# Patient Record
Sex: Female | Born: 1995 | Hispanic: No | Marital: Single | Smoking: Never smoker
Health system: Southern US, Community
[De-identification: ages and names within clinical notes are randomized; demographics above are authoritative.]

## PROBLEM LIST (undated history)

## (undated) DIAGNOSIS — D649 Anemia, unspecified: Secondary | ICD-10-CM

## (undated) DIAGNOSIS — J45909 Unspecified asthma, uncomplicated: Secondary | ICD-10-CM

---
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
## 2016-01-25 ENCOUNTER — Encounter (HOSPITAL_COMMUNITY): Payer: Self-pay | Admitting: *Deleted

## 2016-01-25 ENCOUNTER — Emergency Department (HOSPITAL_COMMUNITY)
Admission: EM | Admit: 2016-01-25 | Discharge: 2016-01-26 | Disposition: A | Discharge status: Home / Self Care | Payer: Self-pay | Attending: Emergency Medicine | Admitting: Emergency Medicine

## 2016-01-25 DIAGNOSIS — J45901 Unspecified asthma with (acute) exacerbation: Secondary | ICD-10-CM | POA: Insufficient documentation

## 2016-01-25 MED ORDER — ALBUTEROL SULFATE (2.5 MG/3ML) 0.083% IN NEBU
5.0000 mg | INHALATION_SOLUTION | Freq: Once | RESPIRATORY_TRACT | Status: AC
  Administered 2016-01-25: 5 mg via RESPIRATORY_TRACT
  Filled 2016-01-25: qty 6

## 2016-01-25 MED ORDER — ALBUTEROL SULFATE HFA 108 (90 BASE) MCG/ACT IN AERS
2.0000 | INHALATION_SPRAY | RESPIRATORY_TRACT | Status: DC | PRN
  Administered 2016-01-26: 2 via RESPIRATORY_TRACT
  Filled 2016-01-25: qty 6.7

## 2016-01-25 MED ORDER — AEROCHAMBER PLUS W/MASK MISC
1.0000 | Freq: Once | Status: AC
  Administered 2016-01-26: 1
  Filled 2016-01-25: qty 1

## 2016-01-25 MED ORDER — IPRATROPIUM BROMIDE 0.02 % IN SOLN
0.5000 mg | Freq: Once | RESPIRATORY_TRACT | Status: AC
  Administered 2016-01-25: 0.5 mg via RESPIRATORY_TRACT
  Filled 2016-01-25: qty 2.5

## 2016-01-25 NOTE — Discharge Instructions (Signed)
Asthma, Adult Asthma is a condition of the lungs in which the airways tighten and narrow. Asthma can make it hard to breathe. Asthma cannot be cured, but medicine and lifestyle changes can help control it. Asthma may be started (triggered) by:  Animal skin flakes (dander).  Dust.  Cockroaches.  Pollen.  Mold.  Smoke.  Cleaning products.  Hair sprays or aerosol sprays.  Paint fumes or strong smells.  Cold air, weather changes, and winds.  Crying or laughing hard.  Stress.  Certain medicines or drugs.  Foods, such as dried fruit, potato chips, and sparkling grape juice.  Infections or conditions (colds, flu).  Exercise.  Certain medical conditions or diseases.  Exercise or tiring activities. HOME CARE   Take medicine as told by your doctor.  Use a peak flow meter as told by your doctor. A peak flow meter is a tool that measures how well the lungs are working.  Record and keep track of the peak flow meter's readings.  Understand and use the asthma action plan. An asthma action plan is a written plan for taking care of your asthma and treating your attacks.  To help prevent asthma attacks:  Do not smoke. Stay away from secondhand smoke.  Change your heating and air conditioning filter often.  Limit your use of fireplaces and wood stoves.  Get rid of pests (such as roaches and mice) and their droppings.  Throw away plants if you see mold on them.  Clean your floors. Dust regularly. Use cleaning products that do not smell.  Have someone vacuum when you are not home. Use a vacuum cleaner with a HEPA filter if possible.  Replace carpet with wood, tile, or vinyl flooring. Carpet can trap animal skin flakes and dust.  Use allergy-proof pillows, mattress covers, and box spring covers.  Wash bed sheets and blankets every week in hot water and dry them in a dryer.  Use blankets that are made of polyester or cotton.  Clean bathrooms and kitchens with bleach.  If possible, have someone repaint the walls in these rooms with mold-resistant paint. Keep out of the rooms that are being cleaned and painted.  Wash hands often. GET HELP IF:  You have make a whistling sound when breaking (wheeze), have shortness of breath, or have a cough even if taking medicine to prevent attacks.  The colored mucus you cough up (sputum) is thicker than usual.  The colored mucus you cough up changes from clear or white to yellow, green, gray, or bloody.  You have problems from the medicine you are taking such as:  A rash.  Itching.  Swelling.  Trouble breathing.  You need reliever medicines more than 2-3 times a week.  Your peak flow measurement is still at 50-79% of your personal best after following the action plan for 1 hour.  You have a fever. GET HELP RIGHT AWAY IF:   You seem to be worse and are not responding to medicine during an asthma attack.  You are short of breath even at rest.  You get short of breath when doing very little activity.  You have trouble eating, drinking, or talking.  You have chest pain.  You have a fast heartbeat.  Your lips or fingernails start to turn blue.  You are light-headed, dizzy, or faint.  Your peak flow is less than 50% of your personal best.   This information is not intended to replace advice given to you by your health care provider. Make sure   you discuss any questions you have with your health care provider.   Document Released: 01/19/2008 Document Revised: 04/23/2015 Document Reviewed: 03/01/2013 Elsevier Interactive Patient Education 2016 [employer] Inc[employer]  

## 2016-01-25 NOTE — ED Provider Notes (Signed)
CSN: [id number]     Arrival date & time 01/25/16  2232 History   First MD Initiated Contact with Patient 01/25/16 2343     Chief Complaint  Patient presents with  . Asthma   PT SAID THAT SHE HAS A HX OF ASTHMA, BUT DOES NOT HAVE AN INHALER.  SHE SAID THAT SHE HAS BEEN OUTSIDE HIKING ALL DAY AND FELT SOB.  SHE WAS WHEEZING AT TRIAGE.  (Consider location/radiation/quality/duration/timing/severity/associated sxs/prior Treatment) Patient is a 20 y.o. female presenting with asthma. The history is provided by the patient.  Asthma This is a recurrent problem. The current episode started 1 to 2 hours ago. The problem has been rapidly improving.    History reviewed. No pertinent past medical history. History reviewed. No pertinent past surgical history. No family history on file. Social History  Substance Use Topics  . Smoking status: Never Smoker   . Smokeless tobacco: None  . Alcohol Use: No   OB History    No data available     Review of Systems  Respiratory: Positive for wheezing.   All other systems reviewed and are negative.     Allergies  Review of patient's allergies indicates no known allergies.  Home Medications   Prior to Admission medications   Not on File   BP 144/95 mmHg  Pulse 102  Temp(Src) 98.6 F (37 C) (Oral)  Resp 18  Ht 5\' 3"  (1.6 m)  Wt 177 lb 1 oz (80.315 kg)  BMI 31.37 kg/m2  SpO2 100%  LMP 01/25/2016 Physical Exam  Constitutional: She is oriented to person, place, and time. She appears well-developed and well-nourished.  HENT:  Head: Normocephalic and atraumatic.  Right Ear: External ear normal.  Left Ear: External ear normal.  Nose: Nose normal.  Mouth/Throat: Oropharynx is clear and moist.  Eyes: Conjunctivae and EOM are normal. Pupils are equal, round, and reactive to light.  Neck: Normal range of motion. Neck supple.  Cardiovascular: Normal rate, regular rhythm, normal heart sounds and intact distal pulses.   Pulmonary/Chest: She has  wheezes.  Abdominal: Soft. Bowel sounds are normal.  Musculoskeletal: Normal range of motion.  Neurological: She is alert and oriented to person, place, and time.  Skin: Skin is warm and dry.  Psychiatric: She has a normal mood and affect. Her behavior is normal. Judgment and thought content normal.  Nursing note and vitals reviewed.   ED Course  Procedures (including critical care time) Labs Review Labs Reviewed - No data to display  Imaging Review No results found. I have personally reviewed and evaluated these images and lab results as part of my medical decision-making.   EKG Interpretation None      MDM  PT RECEIVED 1 NEB IN TRIAGE AND WHEEZING IS GONE.  PT FEELS BETTER.  NO INDICATION FOR STEROIDS.  PT WILL BE GIVEN AN INHALER AND INSTR TO RETURN IF WORSE.   Final diagnoses:  Asthma exacerbation        Jacalyn LefevreJulie Hilberto Burzynski, MD 01/25/16 2357

## 2016-01-25 NOTE — ED Notes (Signed)
The pt has been outside hiking al day.  She has not had a inhaler for one year.  She has chest congestion  lmp now

## 2016-01-26 NOTE — ED Notes (Signed)
Patient able to ambulate independently  

## 2016-09-07 ENCOUNTER — Ambulatory Visit (HOSPITAL_COMMUNITY)
Admission: EM | Admit: 2016-09-07 | Discharge: 2016-09-07 | Disposition: A | Discharge status: Home / Self Care | Payer: Self-pay | Attending: Family Medicine | Admitting: Family Medicine

## 2016-09-07 ENCOUNTER — Encounter (HOSPITAL_COMMUNITY): Payer: Self-pay | Admitting: *Deleted

## 2016-09-07 DIAGNOSIS — D508 Other iron deficiency anemias: Secondary | ICD-10-CM

## 2016-09-07 DIAGNOSIS — M542 Cervicalgia: Secondary | ICD-10-CM

## 2016-09-07 DIAGNOSIS — N939 Abnormal uterine and vaginal bleeding, unspecified: Secondary | ICD-10-CM

## 2016-09-07 DIAGNOSIS — F419 Anxiety disorder, unspecified: Secondary | ICD-10-CM

## 2016-09-07 DIAGNOSIS — R5383 Other fatigue: Secondary | ICD-10-CM

## 2016-09-07 DIAGNOSIS — G4709 Other insomnia: Secondary | ICD-10-CM

## 2016-09-07 DIAGNOSIS — G44209 Tension-type headache, unspecified, not intractable: Secondary | ICD-10-CM

## 2016-09-07 HISTORY — DX: Unspecified asthma, uncomplicated: J45.909

## 2016-09-07 LAB — POCT I-STAT, CHEM 8
BUN: 8 mg/dL (ref 6–20)
Calcium, Ion: 1.21 mmol/L (ref 1.15–1.40)
Chloride: 112 mmol/L — ABNORMAL HIGH (ref 101–111)
Creatinine, Ser: 0.6 mg/dL (ref 0.44–1.00)
GLUCOSE: 103 mg/dL — AB (ref 65–99)
HEMATOCRIT: 31 % — AB (ref 36.0–46.0)
Hemoglobin: 10.5 g/dL — ABNORMAL LOW (ref 12.0–15.0)
POTASSIUM: 3.5 mmol/L (ref 3.5–5.1)
Sodium: 141 mmol/L (ref 135–145)
TCO2: 26 mmol/L (ref 0–100)

## 2016-09-07 MED ORDER — CYCLOBENZAPRINE HCL 10 MG PO TABS: 10.0000 mg | ORAL_TABLET | Freq: Every day | ORAL | 0 refills | Status: AC

## 2016-09-07 MED ORDER — NAPROXEN 500 MG PO TABS: 500.0000 mg | ORAL_TABLET | Freq: Two times a day (BID) | ORAL | 0 refills | Status: AC

## 2016-09-07 MED ORDER — FERROUS SULFATE 325 (65 FE) MG PO TABS: 325.0000 mg | ORAL_TABLET | Freq: Every day | ORAL | 0 refills | Status: AC

## 2016-09-07 MED ORDER — DOCUSATE SODIUM 100 MG PO CAPS
100.0000 mg | ORAL_CAPSULE | Freq: Two times a day (BID) | ORAL | 0 refills | Status: AC
Start: 2016-09-07 — End: ?

## 2016-09-07 NOTE — ED Triage Notes (Signed)
Patient reports prolonged menstrual periods, states she has been on it since December. Reports being very tired, has not had follow up for this. Hx of anemia.  Patient also reports vomiting and headache.

## 2016-09-07 NOTE — ED Provider Notes (Signed)
CSN: [id number]     Arrival date & time 09/07/16  1338 History   First MD Initiated Contact with Patient 09/07/16 1503     Chief Complaint  Patient presents with  . Vaginal Bleeding   (Consider location/radiation/quality/duration/timing/severity/associated sxs/prior Treatment) Patient c/o weakness.  Patient states she has headache.  Patient c/o heavy menstrual bleeding.  Patient c/o anemia.  Patient c/o anxiety depressed mood.  Patient c/o insomnia.   The history is provided by the patient.  Vaginal Bleeding  Quality:  Clots Severity:  Moderate Onset quality:  Gradual Duration:  1 month Timing:  Constant Progression:  Waxing and waning Chronicity:  Recurrent Menstrual history:  Irregular Possible pregnancy: no   Relieved by:  Nothing Worsened by:  Nothing Ineffective treatments:  None tried Associated symptoms: back pain and fatigue     Past Medical History:  Diagnosis Date  . Asthma    History reviewed. No pertinent surgical history. History reviewed. No pertinent family history. Social History  Substance Use Topics  . Smoking status: Never Smoker  . Smokeless tobacco: Never Used  . Alcohol use No   OB History    No data available     Review of Systems  Constitutional: Positive for fatigue.  HENT: Negative.   Eyes: Positive for photophobia.  Respiratory: Negative.   Genitourinary: Positive for vaginal bleeding.  Musculoskeletal: Positive for arthralgias and back pain.  Neurological: Positive for headaches.  Hematological: Bruises/bleeds easily.  Psychiatric/Behavioral: Positive for agitation and sleep disturbance.    Allergies  Patient has no known allergies.  Home Medications   Prior to Admission medications   Medication Sig Start Date End Date Taking? Authorizing Provider  cyclobenzaprine (FLEXERIL) 10 MG tablet Take 1 tablet (10 mg total) by mouth at bedtime. 09/07/16   Deatra Canter, FNP  docusate sodium (COLACE) 100 MG capsule Take 1 capsule  (100 mg total) by mouth every 12 (twelve) hours. 09/07/16   Deatra Canter, FNP  ferrous sulfate 325 (65 FE) MG tablet Take 1 tablet (325 mg total) by mouth daily. 09/07/16   Deatra Canter, FNP  naproxen (NAPROSYN) 500 MG tablet Take 1 tablet (500 mg total) by mouth 2 (two) times daily with a meal. 09/07/16   Deatra Canter, FNP   Meds Ordered and Administered this Visit  Medications - No data to display  There were no vitals taken for this visit. No data found.   Physical Exam  Constitutional: She is oriented to person, place, and time. She appears well-developed and well-nourished.  HENT:  Head: Normocephalic and atraumatic.  Right Ear: External ear normal.  Left Ear: External ear normal.  Mouth/Throat: Oropharynx is clear and moist.  Eyes: Conjunctivae and EOM are normal. Pupils are equal, round, and reactive to light.  Neck: Normal range of motion. Neck supple.  Cardiovascular: Normal rate, regular rhythm and normal heart sounds.   Pulmonary/Chest: Effort normal and breath sounds normal.  Abdominal: Soft. Bowel sounds are normal.  Musculoskeletal: She exhibits tenderness. She exhibits no edema.  TTP myofascial and cervical paraspinous muscles TTP left upper lumbar muscles and possible muscles spasm left upper lumbar spine.  Neurological: She is alert and oriented to person, place, and time.  Psychiatric:  Depressed mood  Nursing note and vitals reviewed.   Urgent Care Course     Procedures (including critical care time)  Labs Review Labs Reviewed  POCT I-STAT, CHEM 8 - Abnormal; Notable for the following:       Result Value  Chloride 112 (*)    Glucose, Bld 103 (*)    Hemoglobin 10.5 (*)    HCT 31.0 (*)    All other components within normal limits    Imaging Review No results found.   Visual Acuity Review  Right Eye Distance:   Left Eye Distance:   Bilateral Distance:    Right Eye Near:   Left Eye Near:    Bilateral Near:         MDM   1.  Abnormal uterine bleeding   2. Other insomnia   3. Anxiety   4. Tension headache   5. Cervicalgia   6. Fatigue, unspecified type   7. Other iron deficiency anemia    Naprosyn 500mg  one po bid x 7 days Flexeril 10mg  one po qhs #10 Iron sulfate 325 one po qd #30 Colace 100mg  one po bid #60  Explained she needs to get in with PCP and have her multiple chronic problems addressed.  I have provided name, address, phone number of PCP she can call make an appointment with and follow up.  May follow up prn here as well.      Deatra CanterWilliam J Oxford, FNP 09/07/16 [phone]

## 2016-10-25 ENCOUNTER — Emergency Department (HOSPITAL_COMMUNITY)
Admission: EM | Admit: 2016-10-25 | Discharge: 2016-10-25 | Disposition: A | Discharge status: Home / Self Care | Payer: Self-pay | Attending: Emergency Medicine | Admitting: Emergency Medicine

## 2016-10-25 ENCOUNTER — Encounter (HOSPITAL_COMMUNITY): Payer: Self-pay | Admitting: Emergency Medicine

## 2016-10-25 DIAGNOSIS — J45909 Unspecified asthma, uncomplicated: Secondary | ICD-10-CM | POA: Insufficient documentation

## 2016-10-25 DIAGNOSIS — Z79899 Other long term (current) drug therapy: Secondary | ICD-10-CM | POA: Insufficient documentation

## 2016-10-25 DIAGNOSIS — K1379 Other lesions of oral mucosa: Secondary | ICD-10-CM | POA: Insufficient documentation

## 2016-10-25 DIAGNOSIS — K121 Other forms of stomatitis: Secondary | ICD-10-CM

## 2016-10-25 MED ORDER — SILVER NITRATE-POT NITRATE 75-25 % EX MISC
1.0000 "application " | Freq: Once | CUTANEOUS | Status: AC
  Administered 2016-10-25: 1 via TOPICAL
  Filled 2016-10-25: qty 1

## 2016-10-25 NOTE — ED Provider Notes (Signed)
[facility]-EMERGENCY DEPT Provider Note   CSN: [id number] Arrival date & time: 10/25/16  1830  By signing my name below, I, Linna Darner, attest that this documentation has been prepared under the direction and in the presence of non-physician practitioner, [facility] M. Damian Leavell, NP. Electronically Signed: Linna Darner, Scribe. 10/25/2016. 7:04 PM.  History   Chief Complaint Chief Complaint  Patient presents with  . Mouth Lesions    The history is provided by the patient. No language interpreter was used.  Mouth Lesions  Location:  Buccal mucosa Buccal mucosa location:  R buccal mucosa Quality:  Painful and ulcerous Pain details:    Quality:  Unable to specify   Severity:  Moderate   Duration:  6 days   Timing:  Constant   Progression:  Worsening Onset quality:  Unable to specify Severity:  Moderate Duration:  6 days Progression:  Worsening Chronicity:  New Relieved by:  None tried Exacerbated by: applied pressure. Ineffective treatments:  None tried Associated symptoms: no congestion, no fever, no rash and no sore throat     HPI Comments: Shannon Hays is a 21 y.o. female who presents to the Emergency Department complaining of a gradually worsening, painful oral lesion to the right buccal mucosa beginning 5 days ago. She notes some associated right-sided cervical adenopathy. Pt endorses pain exacerbation with applied pressure to the lesion. No known contacts with similar symptoms. She denies fever, chills, sore throat, dental problem, nausea, vomiting, diarrhea, abdominal pain, rash, cough, nasal congestion, or any other associated symptoms.  Past Medical History:  Diagnosis Date  . Asthma     There are no active problems to display for this patient.   History reviewed. No pertinent surgical history.  OB History    No data available       Home Medications    Prior to Admission medications   Medication Sig Start Date End Date Taking? Authorizing Provider    cyclobenzaprine (FLEXERIL) 10 MG tablet Take 1 tablet (10 mg total) by mouth at bedtime. 09/07/16   Deatra Canter, FNP  docusate sodium (COLACE) 100 MG capsule Take 1 capsule (100 mg total) by mouth every 12 (twelve) hours. 09/07/16   Deatra Canter, FNP  ferrous sulfate 325 (65 FE) MG tablet Take 1 tablet (325 mg total) by mouth daily. 09/07/16   Deatra Canter, FNP  naproxen (NAPROSYN) 500 MG tablet Take 1 tablet (500 mg total) by mouth 2 (two) times daily with a meal. 09/07/16   Deatra Canter, FNP    Family History No family history on file.  Social History Social History  Substance Use Topics  . Smoking status: Never Smoker  . Smokeless tobacco: Never Used  . Alcohol use No     Allergies   Patient has no known allergies.   Review of Systems Review of Systems  Constitutional: Negative for chills and fever.  HENT: Positive for mouth sores. Negative for congestion, dental problem and sore throat.   Respiratory: Negative for cough.   Gastrointestinal: Negative for abdominal pain, diarrhea, nausea and vomiting.  Skin: Negative for rash.  Hematological: Positive for adenopathy.  All other systems reviewed and are negative.   Physical Exam Updated Vital Signs BP 118/69 (BP Location: Left Arm)   Pulse 91   Temp 99 F (37.2 C) (Oral)   Resp 16   Ht 5\' 4"  (1.626 m)   Wt 83.9 kg   SpO2 99%   BMI 31.76 kg/m   Physical Exam  Constitutional:  She is oriented to person, place, and time. She appears well-developed and well-nourished. No distress.  HENT:  Head: Normocephalic and atraumatic.  Mouth/Throat: Uvula is midline. No posterior oropharyngeal edema or posterior oropharyngeal erythema.  Ulcer lesion to the buccal mucosa on the right.  Eyes: Conjunctivae and EOM are normal.  Neck: Neck supple. No tracheal deviation present.  Cardiovascular: Normal rate.   Pulmonary/Chest: Effort normal. No respiratory distress.  Musculoskeletal: Normal range of motion.   Lymphadenopathy:    She has cervical adenopathy.  Slightly enlarged anterior lymph node on the right.  Neurological: She is alert and oriented to person, place, and time.  Skin: Skin is warm and dry.  Psychiatric: She has a normal mood and affect. Her behavior is normal.  Nursing note and vitals reviewed.   ED Treatments / Results  Labs (all labs ordered are listed, but only abnormal results are displayed) Labs Reviewed - No data to display  Procedures Procedures (including critical care time)  7:32 PM: Oral ulcer cauterized with silver nitrite. Patient tolerated well.  DIAGNOSTIC STUDIES: Oxygen Saturation is 100% on RA, normal by my interpretation.    COORDINATION OF CARE: 7:10 PM Discussed treatment plan with pt at bedside and pt agreed to plan.  Medications Ordered in ED Medications  silver nitrate applicators applicator 1 application (1 application Topical Given 10/25/16 1929)     Initial Impression / Assessment and Plan / ED Course  I have reviewed the triage vital signs and the nursing notes.  Final Clinical Impressions(s) / ED Diagnoses  21 y.o. female with oral ulcer and right anterior cervical lymph node enlargement stable for d/c without fever, rash or other problems. Discussed with the patient causes of oral ulcers and treatment. She voices understandings. Return precautions given.   Final diagnoses:  Oral ulcer    New Prescriptions Discharge Medication List as of 10/25/2016  7:31 PM    I personally performed the services described in this documentation, which was scribed in my presence. The recorded information has been reviewed and is accurate.    [address] Clear[place] Lizabeth Fellner, [place] 10/26/16 [id number]    Mancel BaleElliott Wentz, MD 10/28/16 1045

## 2016-10-25 NOTE — ED Notes (Signed)
Pt presents with small lesion to inside right sided cheek. See providers assessment.

## 2016-10-25 NOTE — ED Triage Notes (Signed)
Pt c/o ulcer in right side of  Mouth x's 5 days.  Pt st's very painful to talk or eat

## 2016-11-03 ENCOUNTER — Encounter: Payer: Self-pay | Admitting: Obstetrics & Gynecology

## 2016-11-03 ENCOUNTER — Ambulatory Visit (INDEPENDENT_AMBULATORY_CARE_PROVIDER_SITE_OTHER): Payer: Self-pay | Admitting: Obstetrics & Gynecology

## 2016-11-03 VITALS — BP 131/79 | HR 95 | Wt 179.1 lb

## 2016-11-03 DIAGNOSIS — D649 Anemia, unspecified: Secondary | ICD-10-CM

## 2016-11-03 DIAGNOSIS — N938 Other specified abnormal uterine and vaginal bleeding: Secondary | ICD-10-CM

## 2016-11-03 NOTE — Progress Notes (Signed)
Patient has been experiencing abnormal bleeding for several months now.

## 2016-11-03 NOTE — Progress Notes (Signed)
   Subjective:    Patient ID: Shannon MaffucciIlse V Hernandez-Salais, female    DOB: 07/08/1996, 21 y.o.   MRN: [id number]  HPI 21 yo S H G0 here today with the issue of heavy periods. Menarche at about 21 yo. Her periods are irregular. She has bled for almost 3 months straight and in the past has skipped periods. Her hbg was 10.5 on 09/07/16. She has not seen a doctor for this or been treated for this, has never had an u/s.   Review of Systems Virginal [place] school in [place], has been in the [place] for about 18 months Works at [employer]    Objective:   Physical Exam [id number], appeared teary when we talked about a vaginal u/s, very worried about pain Abd- benign       Assessment & Plan:  DUB with amenia- schedule gyn u/s, warned her that this will be a vaginal approach. I will also check a TSH

## 2016-11-04 LAB — TSH: TSH: 2.6 u[IU]/mL (ref 0.450–4.500)

## 2016-11-10 ENCOUNTER — Ambulatory Visit (HOSPITAL_COMMUNITY)
Admission: RE | Admit: 2016-11-10 | Discharge: 2016-11-10 | Disposition: A | Discharge status: Home / Self Care | Payer: Self-pay | Source: Ambulatory Visit | Attending: Obstetrics & Gynecology | Admitting: Obstetrics & Gynecology

## 2016-11-10 DIAGNOSIS — N938 Other specified abnormal uterine and vaginal bleeding: Secondary | ICD-10-CM | POA: Insufficient documentation

## 2016-12-08 ENCOUNTER — Telehealth: Payer: Self-pay | Admitting: *Deleted

## 2016-12-08 NOTE — Telephone Encounter (Signed)
Pt left message yesterday stating that she had [place] a month ago and has not been called with the results.

## 2016-12-09 NOTE — Telephone Encounter (Signed)
Attempted to call patient. There was no answer and voice mail was not set up. Will try to call later. U/s and TSH were normal.

## 2016-12-13 NOTE — Telephone Encounter (Signed)
Patient has been informed of test results ultasound and tsh.

## 2017-06-27 ENCOUNTER — Encounter: Payer: Self-pay | Admitting: *Deleted

## 2017-06-27 ENCOUNTER — Telehealth: Payer: Self-pay | Admitting: *Deleted

## 2017-06-27 NOTE — Telephone Encounter (Signed)
Received message left on nurse voicemail on 06/23/17 at 1329.  Patient states she was previously seen in our office for DUB.  States she has had normal periods from approximately March to September.  States she has been bleeding the month of October.  States she had an ultrasound in March and never got the results.  Requests a return call to obtain her results.    Attempted to contact patient via phone.  No answer, left voicemail message.  Will send letter.

## 2017-06-29 NOTE — Telephone Encounter (Signed)
Received message left on nurse voicemail on 06/29/17 at 0933.  Patient states she had previously gotten her ultrasound results but would like to review them again.    Attempted to contact patient via phone.  Message said phone forwarded to automated message system.  Mailed letter to patient with results on 06/27/17.

## 2017-12-20 ENCOUNTER — Other Ambulatory Visit: Payer: Self-pay

## 2017-12-20 ENCOUNTER — Emergency Department (HOSPITAL_COMMUNITY)
Admission: EM | Admit: 2017-12-20 | Discharge: 2017-12-20 | Disposition: A | Discharge status: Home / Self Care | Payer: Self-pay | Attending: Emergency Medicine | Admitting: Emergency Medicine

## 2017-12-20 ENCOUNTER — Encounter (HOSPITAL_COMMUNITY): Payer: Self-pay | Admitting: Emergency Medicine

## 2017-12-20 DIAGNOSIS — L559 Sunburn, unspecified: Secondary | ICD-10-CM | POA: Insufficient documentation

## 2017-12-20 DIAGNOSIS — Z79899 Other long term (current) drug therapy: Secondary | ICD-10-CM | POA: Insufficient documentation

## 2017-12-20 DIAGNOSIS — J45909 Unspecified asthma, uncomplicated: Secondary | ICD-10-CM | POA: Insufficient documentation

## 2017-12-20 HISTORY — DX: Anemia, unspecified: D64.9

## 2017-12-20 MED ORDER — MUPIROCIN 2 % EX OINT: 1.0000 "application " | TOPICAL_OINTMENT | Freq: Three times a day (TID) | CUTANEOUS | 1 refills | Status: AC

## 2017-12-20 NOTE — ED Provider Notes (Signed)
[facility] EMERGENCY DEPARTMENT Provider Note   CSN: [id number] Arrival date & time: 12/20/17  1233     History   Chief Complaint Chief Complaint  Patient presents with  . Skin irritation    HPI Shannon Hays is a 22 y.o. female with no significant past medical history presents emergency department today for scalp irritation.  Patient states that she was at a concert and for several hours on Friday, Saturday and Sunday.  She noticed some redness on the part of her hair the day later.  She notes the area is red and tender to the touch.  She reports that she has some yellow crusting along the area as well that she has picked off in the past.  She denies any blisters that have formed or pop. Denies fever, chills, contacts with persons with similar rash, or any changes in lotions/soaps/detergents, exposure to animal or plant irritants, and denies swelling or purulent discharge. No new medications.  No lip swelling, tongue swelling, difficulty breathing or swallowing, abdominal cramping, vomiting or diarrhea.  HPI  Past Medical History:  Diagnosis Date  . Anemia   . Asthma     Patient Active Problem List   Diagnosis Date Noted  . DUB (dysfunctional uterine bleeding) 11/03/2016  . Anemia 11/03/2016    History reviewed. No pertinent surgical history.   OB History   None      Home Medications    Prior to Admission medications   Medication Sig Start Date End Date Taking? Authorizing Provider  cyclobenzaprine (FLEXERIL) 10 MG tablet Take 1 tablet (10 mg total) by mouth at bedtime. 09/07/16   Deatra Canter, FNP  docusate sodium (COLACE) 100 MG capsule Take 1 capsule (100 mg total) by mouth every 12 (twelve) hours. 09/07/16   Deatra Canter, FNP  ferrous sulfate 325 (65 FE) MG tablet Take 1 tablet (325 mg total) by mouth daily. 09/07/16   Deatra Canter, FNP  naproxen (NAPROSYN) 500 MG tablet Take 1 tablet (500 mg total) by mouth 2 (two) times  daily with a meal. 09/07/16   Oxford, Anselm Pancoast, FNP    Family History No family history on file.  Social History Social History   Tobacco Use  . Smoking status: Never Smoker  . Smokeless tobacco: Never Used  Substance Use Topics  . Alcohol use: No  . Drug use: No     Allergies   Patient has no known allergies.   Review of Systems Review of Systems  All other systems reviewed and are negative.    Physical Exam Updated Vital Signs BP 122/76 (BP Location: Right Arm)   Pulse 61   Temp 98 F (36.7 C) (Oral)   Resp 18   Ht  (1.651 m)   Wt 81.2 kg (179 lb)   LMP 11/24/2017   SpO2 100%   BMI 29.79 kg/m   Physical Exam  Constitutional: She appears well-developed and well-nourished.  HENT:  Head: Normocephalic and atraumatic.  Right Ear: External ear normal.  Left Ear: External ear normal.  Nose: Nose normal.  Mouth/Throat: Uvula is midline, oropharynx is clear and moist and mucous membranes are normal. No tonsillar exudate.  No angioedema.  Patient does have redness along the part of her mid scalp.  There is small yellow crusting along this that is attached to patient's hair.  Eyes: Pupils are equal, round, and reactive to light. Right eye exhibits no discharge. Left eye exhibits no discharge. No scleral icterus.  Neck: Trachea normal. Neck supple. No spinous process tenderness present. No neck rigidity. Normal range of motion present.  Cardiovascular: Normal rate, regular rhythm and intact distal pulses.  No murmur heard. Pulses:      Radial pulses are 2+ on the right side, and 2+ on the left side.       Dorsalis pedis pulses are 2+ on the right side, and 2+ on the left side.       Posterior tibial pulses are 2+ on the right side, and 2+ on the left side.  No lower extremity swelling or edema. Calves symmetric in size bilaterally.  Pulmonary/Chest: Effort normal and breath sounds normal. She exhibits no tenderness.  Abdominal: Soft. Bowel sounds are normal.  There is no tenderness. There is no rebound and no guarding.  Musculoskeletal: She exhibits no edema.  Lymphadenopathy:    She has no cervical adenopathy.  Neurological: She is alert.  Skin: Skin is warm and dry. No rash noted. She is not diaphoretic.  No blisters, no pustules, no warmth, no draining sinus tracts, no superficial abscesses, no bullous impetigo, no vesicles, no desquamation, no target lesions with dusky purpura or a central bulla.  No vesicular-like rash that follows a dermatome.   Psychiatric: She has a normal mood and affect.  Nursing note and vitals reviewed.    ED Treatments / Results  Labs (all labs ordered are listed, but only abnormal results are displayed) Labs Reviewed - No data to display  EKG None  Radiology No results found.  Procedures Procedures (including critical care time)  Medications Ordered in ED Medications - No data to display   Initial Impression / Assessment and Plan / ED Course  I have reviewed the triage vital signs and the nursing notes.  Pertinent labs & imaging results that were available during my care of the patient were reviewed by me and considered in my medical decision making (see chart for details).     22 year old female presenting with skin irritation on the top of her scalp that is consistent with sunburn and possibly developing impetigo.  Patient without difficulty breathing or swallowing.  She has a patent airway without stridor and is handling secretions without difficulty.  No angioedema.  No concern for allergic reaction.. No concern for SJS, TEN, TSS, tick borne illness, syphilis, shingles or other life-threatening condition.  Will discharge home with mupirocin cream.  Recommended sunscreen, hats and covering exposed skin when outside in sun for long periods of time. I advised the patient to follow-up with PCP this week. Specific return precautions discussed. Time was given for all questions to be answered. The patient  verbalized understanding and agreement with plan. The patient appears safe for discharge home.  Final Clinical Impressions(s) / ED Diagnoses   Final diagnoses:  Sunburn    ED Discharge Orders        Ordered    mupirocin ointment (BACTROBAN) 2 %  3 times daily     12/20/17 1457       Jacinto Halim, Cordelia Poche 12/20/17 1459    Derwood Kaplan, MD 12/21/17 [phone]

## 2017-12-20 NOTE — ED Triage Notes (Signed)
Patient presents ambulatory c/o scalp irritation. States she was out in the sun and now has dry itching scalp.

## 2017-12-20 NOTE — Discharge Instructions (Signed)
Apply ointment to the affected area as directed.  Wear sunscreen when in the sun for prolonged periods of time.  Follow up with your PCP or establish care with one this week.  If you develop worsening or new concerning symptoms you can return to the emergency department for re-evaluation.

## 2018-03-28 IMAGING — US US PELVIS COMPLETE
1 series · 15 of 25 positions shown · non-contrast
Comparison: None.

CLINICAL DATA: Dysfunctional uterine bleeding, chronic

EXAM:
TRANSABDOMINAL ULTRASOUND OF PELVIS
TECHNIQUE: Transabdominal ultrasound examination of the pelvis was performed
including evaluation of the uterus, ovaries, adnexal regions, and
pelvic cul-de-sac.

[Series 1: us pelvis complete · 15 of 27 slices shown]
[im 1/27]
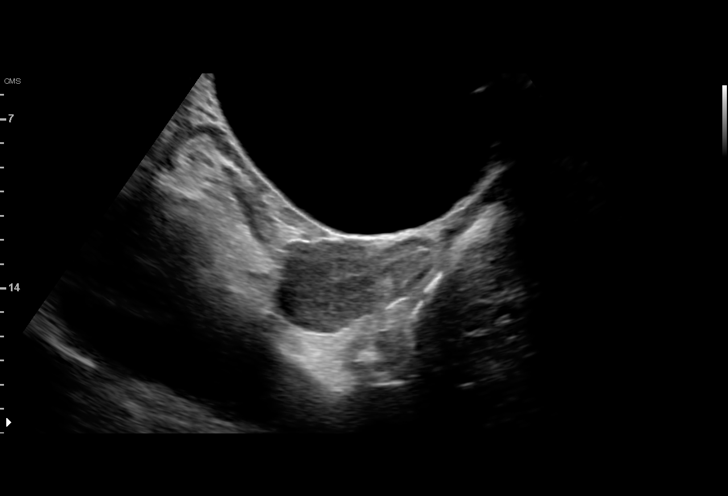
[im 3/27]
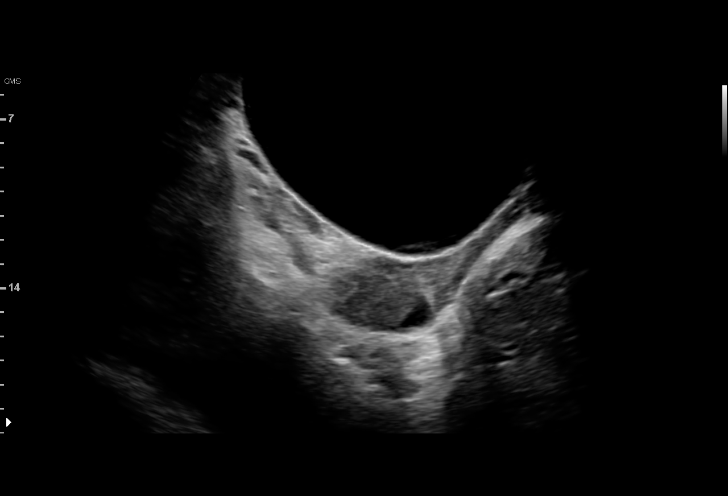
[im 5/27]
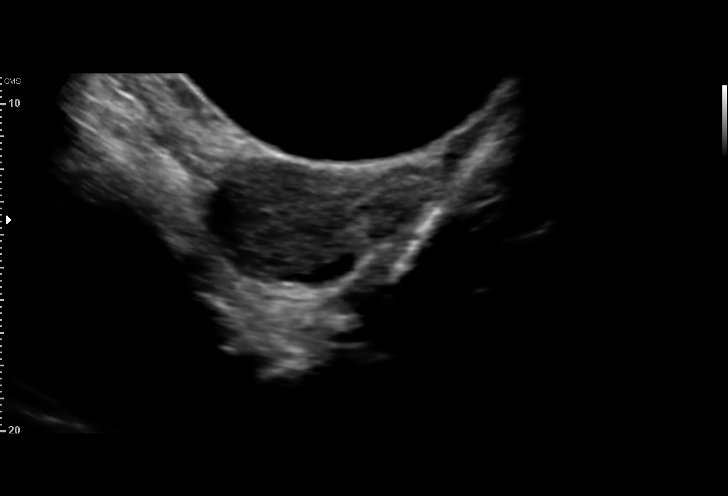
[im 6/27]
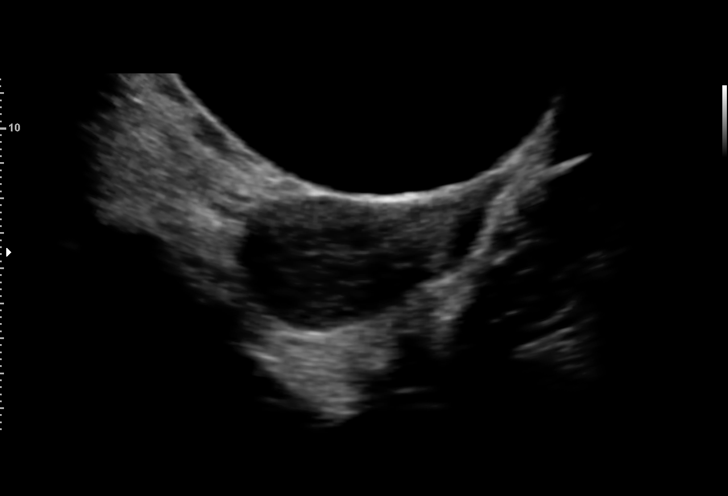
[im 8/27]
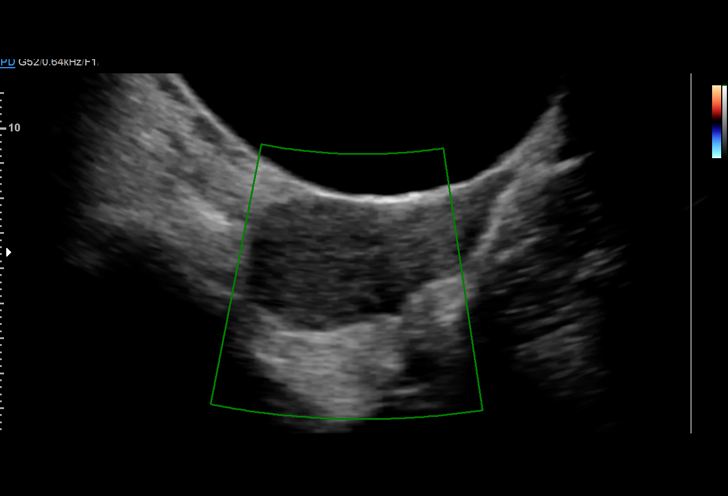
[im 10/27]
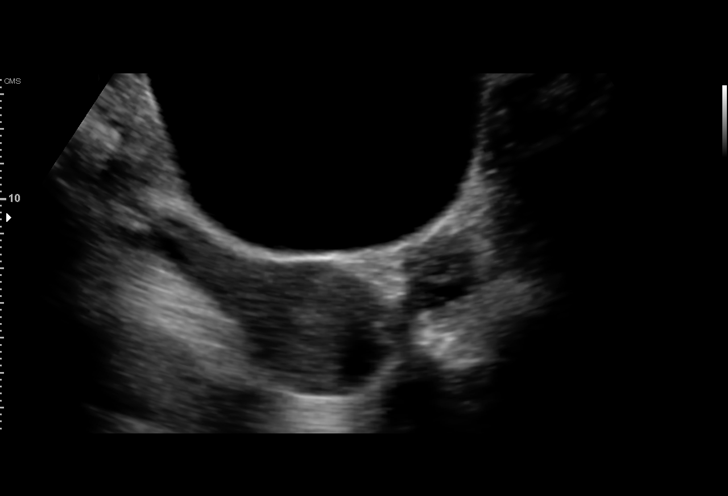
[im 11/27]
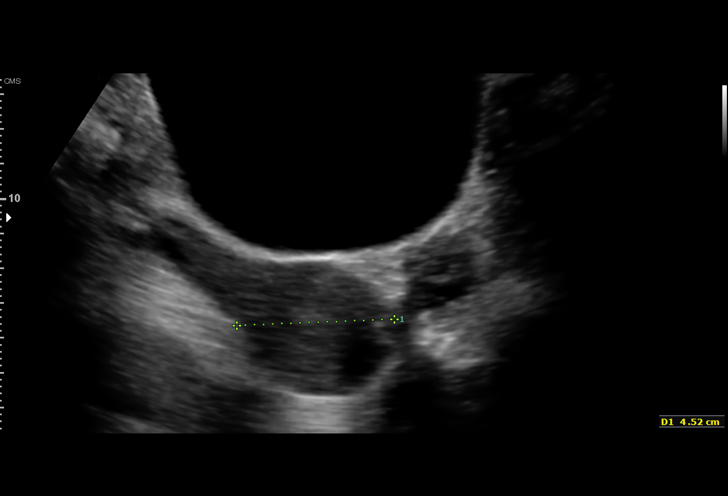
[im 14/27]
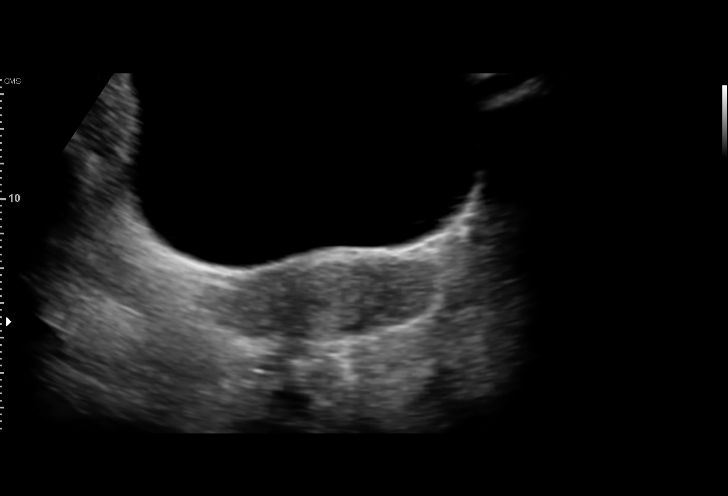
[im 16/27]
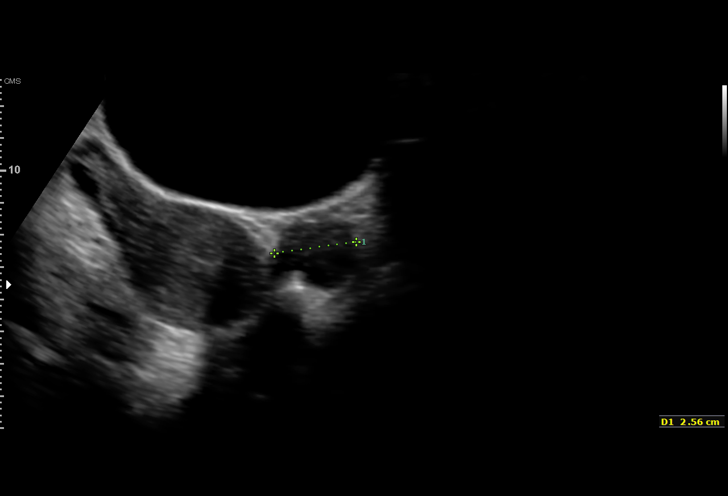
[im 17/27]
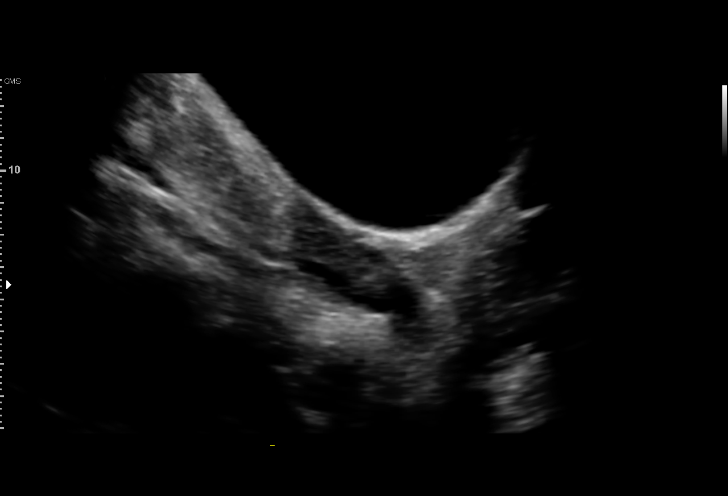
[im 19/27]
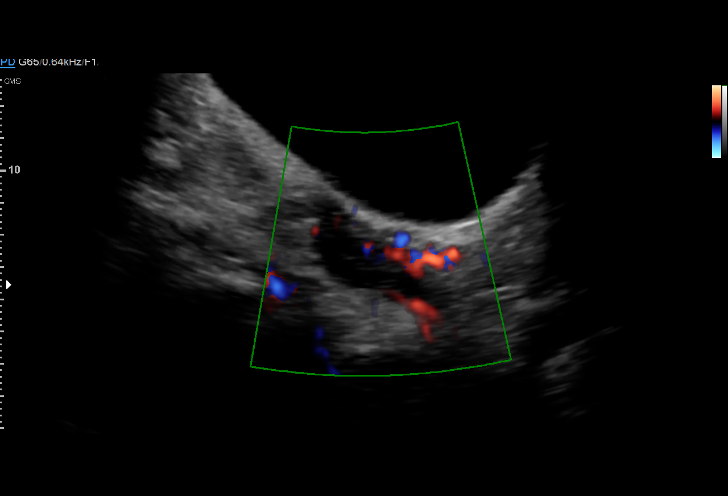
[im 21/27]
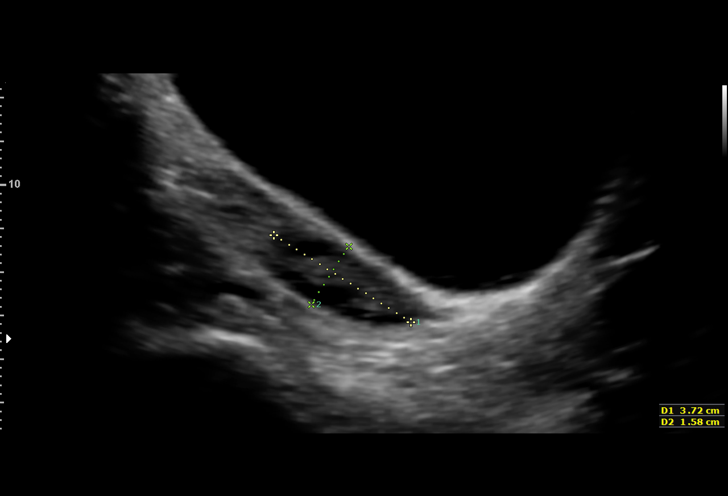
[im 22/27]
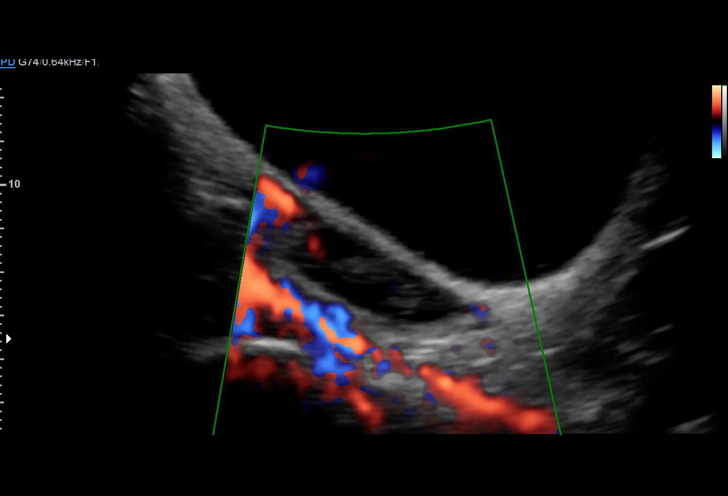
[im 24/27]
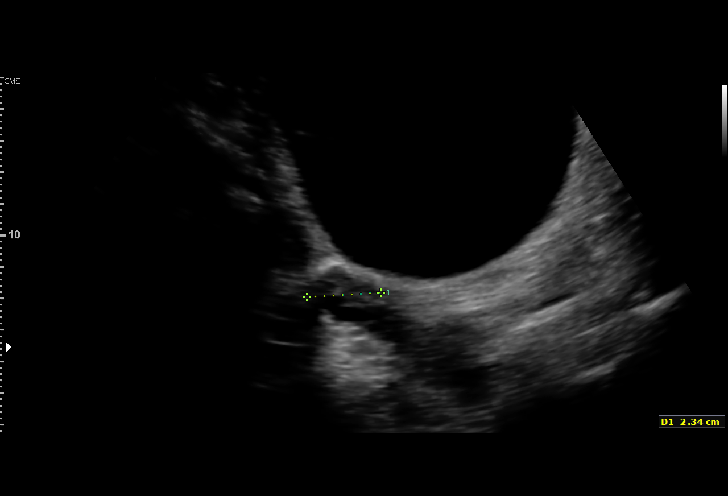
[im 27/27]
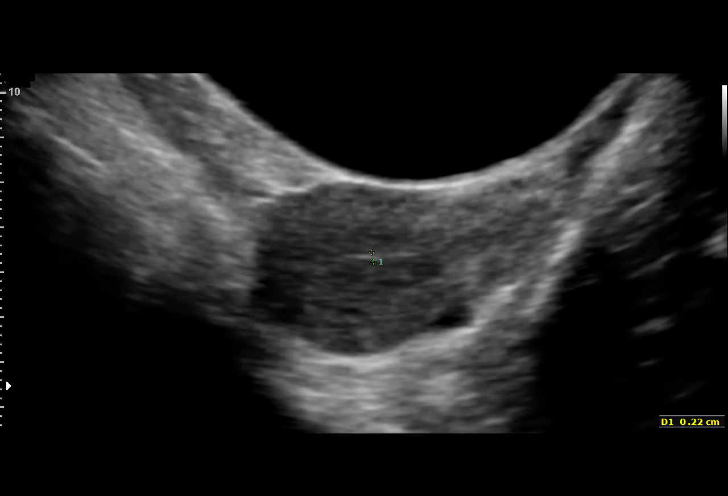

[15 of 25 positions shown; findings below may reference images not displayed]

FINDINGS: Uterus

Measurements: 7.4 x 4.0 x 4.5 cm. No fibroids or other mass
visualized.

Endometrium

Thickness: 2 mm in thickness.  No focal abnormality visualized.

Right ovary

Measurements: 3.7 x 1.6 x 2.3 cm. Normal appearance/no adnexal mass.

Left ovary

Measurements: 4.8 x 2.3 x 2.6 cm. Normal appearance/no adnexal mass.

Other findings:  No abnormal free fluid.
IMPRESSION: Normal transabdominal pelvic ultrasound.

## 2018-08-11 ENCOUNTER — Other Ambulatory Visit: Payer: Self-pay

## 2018-08-11 ENCOUNTER — Emergency Department (HOSPITAL_COMMUNITY)
Admission: EM | Admit: 2018-08-11 | Discharge: 2018-08-11 | Disposition: A | Discharge status: Home / Self Care | Payer: Self-pay | Attending: Emergency Medicine | Admitting: Emergency Medicine

## 2018-08-11 DIAGNOSIS — R0982 Postnasal drip: Secondary | ICD-10-CM | POA: Insufficient documentation

## 2018-08-11 DIAGNOSIS — R05 Cough: Secondary | ICD-10-CM | POA: Insufficient documentation

## 2018-08-11 DIAGNOSIS — R0981 Nasal congestion: Secondary | ICD-10-CM | POA: Insufficient documentation

## 2018-08-11 MED ORDER — FLUTICASONE PROPIONATE 50 MCG/ACT NA SUSP: 1.0000 | Freq: Every day | NASAL | 2 refills | Status: DC

## 2018-08-11 MED ORDER — DM-GUAIFENESIN ER 30-600 MG PO TB12: 1.0000 | ORAL_TABLET | Freq: Two times a day (BID) | ORAL | 0 refills | Status: DC

## 2018-08-11 NOTE — ED Triage Notes (Signed)
Pt has had congestions and cough for a few weeks, OTC medications have not working, continues to get worse.

## 2018-08-11 NOTE — Discharge Instructions (Signed)
Take the prescribed medication as directed.  Can also try the breathe right strips that we talked about to help when sleeping.  Make sure to drink plenty of water when taking mucinex. Follow-up with your primary care doctor. Return to the ED for new or worsening symptoms.

## 2018-08-11 NOTE — ED Provider Notes (Addendum)
[facility]   CSN: [id number] Arrival date & time: 08/11/18  1847     History   Chief Complaint Chief Complaint  Patient presents with  . Nasal Congestion  . Cough    HPI Shannon Hays is a 22 y.o. female.  The history is provided by the patient and medical records.  Cough     22 year old female presenting to the ED with ongoing nasal congestion.  States this has been present for about 2 weeks.  States she is just very frustrated because she cannot breathe out of her nose.  States she has a lot of congestion in her throat and she only coughs to clear her throat but she does this voluntarily.  She does not have any chest pain or shortness of breath.  She has not noticed any fever or chills.  She has had several sick contacts with similar.  She has tried some over-the-counter allergy medicine and Sudafed without much relief.  History reviewed. No pertinent past medical history.  There are no active problems to display for this patient.   History reviewed. No pertinent surgical history.   OB History   No obstetric history on file.      Home Medications    Prior to Admission medications   Not on File    Family History No family history on file.  Social History Social History   Tobacco Use  . Smoking status: Not on file  Substance Use Topics  . Alcohol use: Not on file  . Drug use: Not on file     Allergies   Patient has no known allergies.   Review of Systems Review of Systems  HENT: Positive for congestion.   All other systems reviewed and are negative.    Physical Exam Updated Vital Signs BP 129/81 (BP Location: Right Arm)   Pulse (!) 106   Temp 98 F (36.7 C) (Oral)   Resp 16   Ht 5\' 5"  (1.651 m)   Wt 86.2 kg   SpO2 100%   BMI 31.62 kg/m   Physical Exam Vitals signs and nursing note reviewed.  Constitutional:      Appearance: She is well-developed.  HENT:   Head: Normocephalic and atraumatic.     Right Ear: Tympanic membrane and ear canal normal.     Left Ear: Tympanic membrane and ear canal normal.     Nose: Congestion and rhinorrhea present. Rhinorrhea is clear.     Comments: No sinus tenderness    Mouth/Throat:     Comments: PND present; Tonsils overall normal in appearance bilaterally without exudate; uvula midline without evidence of peritonsillar abscess; handling secretions appropriately; no difficulty swallowing or speaking; normal phonation without stridor Eyes:     Conjunctiva/sclera: Conjunctivae normal.     Pupils: Pupils are equal, round, and reactive to light.  Neck:     Musculoskeletal: Normal range of motion.  Cardiovascular:     Rate and Rhythm: Normal rate and regular rhythm.     Heart sounds: Normal heart sounds.  Pulmonary:     Effort: Pulmonary effort is normal.     Breath sounds: Normal breath sounds.  Abdominal:     General: Bowel sounds are normal.     Palpations: Abdomen is soft.  Musculoskeletal: Normal range of motion.  Skin:    General: Skin is warm and dry.  Neurological:     Mental Status: She is alert and oriented to person, place, and time.  ED Treatments / Results  Labs (all labs ordered are listed, but only abnormal results are displayed) Labs Reviewed - No data to display  EKG None  Radiology No results found.  Procedures Procedures (including critical care time)  Medications Ordered in ED Medications - No data to display   Initial Impression / Assessment and Plan / ED Course  I have reviewed the triage vital signs and the nursing notes.  Pertinent labs & imaging results that were available during my care of the patient were reviewed by me and considered in my medical decision making (see chart for details).  22 year old female here with ongoing nasal congestion for approximately 2 weeks.  She is afebrile and nontoxic.  She does have nasal congestion and clear rhinorrhea on exam  along with postnasal drip.  She does not have any noted sinus pressure.  No tonsillar edema or exudates.  Her lungs are clear without any wheezes or rhonchi.  Recommended over-the-counter Mucinex along with nasal spray to help clear congestion.  Can follow-up closely with PCP.  Return here for new concerns.  Final Clinical Impressions(s) / ED Diagnoses   Final diagnoses:  Nasal congestion  Postnasal drip    ED Discharge Orders         Ordered    dextromethorphan-guaiFENesin [facility](MUCINEX DM) 30-600 MG 12hr tablet  2 times daily     08/11/18 2223    fluticasone (FLONASE) 50 MCG/ACT nasal spray  Daily     08/11/18 2223           Garlon HatchetSanders, Cerise Lieber M, PA-C 08/11/18 2226    Tilden Fossaees, Elizabeth, MD 08/12/18 0113    Garlon HatchetSanders, Chelcee Korpi M, PA-C 08/18/18 0220    Tilden Fossaees, Elizabeth, MD 08/18/18 [phone]

## 2018-08-21 ENCOUNTER — Telehealth: Payer: Self-pay | Admitting: General Practice

## 2018-08-21 NOTE — Telephone Encounter (Signed)
Patient called and left message on nurse voicemail line stating she is requesting her ultrasound results from 10/2016. Patient states she is still having the same ongoing issue of heavy bleeding.  Called patient and informed her of results and advised if she was still having problems with her periods to contact our front office staff for an appt. Patient verbalized understanding & had no questions.
